# Patient Record
Sex: Male | Born: 2000 | Race: Black or African American | Hispanic: No | Marital: Single
Health system: Southern US, Community
[De-identification: ages and names within clinical notes are randomized; demographics above are authoritative.]

## PROBLEM LIST (undated history)

## (undated) DIAGNOSIS — Z789 Other specified health status: Secondary | ICD-10-CM

## (undated) HISTORY — PX: NO PAST SURGERIES: SHX2092

---
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
## 2021-10-09 ENCOUNTER — Ambulatory Visit
Admission: EM | Admit: 2021-10-09 | Discharge: 2021-10-09 | Disposition: A | Discharge status: Home / Self Care | Payer: BC Managed Care – PPO | Attending: Internal Medicine | Admitting: Internal Medicine

## 2021-10-09 DIAGNOSIS — S161XXA Strain of muscle, fascia and tendon at neck level, initial encounter: Secondary | ICD-10-CM | POA: Diagnosis not present

## 2021-10-09 MED ORDER — IBUPROFEN 600 MG PO TABS: 600.0000 mg | ORAL_TABLET | Freq: Four times a day (QID) | ORAL | 0 refills | Status: DC | PRN

## 2021-10-09 MED ORDER — CYCLOBENZAPRINE HCL 5 MG PO TABS: 5.0000 mg | ORAL_TABLET | Freq: Two times a day (BID) | ORAL | 0 refills | Status: DC | PRN
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
## 2021-10-09 NOTE — Discharge Instructions (Addendum)
You have a muscle strain of your neck.  You have been prescribed muscle relaxer and ibuprofen to take as needed.  Please be advised that muscle relaxer can cause drowsiness.  Do not take any additional ibuprofen, Advil, Aleve while taking this ibuprofen.  Follow-up if symptoms persist or worsen.

## 2023-05-17 ENCOUNTER — Emergency Department (HOSPITAL_BASED_OUTPATIENT_CLINIC_OR_DEPARTMENT_OTHER): Payer: BC Managed Care – PPO | Admitting: Radiology

## 2023-05-17 ENCOUNTER — Other Ambulatory Visit: Payer: Self-pay

## 2023-05-17 ENCOUNTER — Encounter (HOSPITAL_BASED_OUTPATIENT_CLINIC_OR_DEPARTMENT_OTHER): Payer: Self-pay

## 2023-05-17 ENCOUNTER — Emergency Department (HOSPITAL_BASED_OUTPATIENT_CLINIC_OR_DEPARTMENT_OTHER)
Admission: EM | Admit: 2023-05-17 | Discharge: 2023-05-17 | Disposition: A | Discharge status: Home / Self Care | Payer: BC Managed Care – PPO | Attending: Emergency Medicine | Admitting: Emergency Medicine

## 2023-05-17 ENCOUNTER — Other Ambulatory Visit (HOSPITAL_BASED_OUTPATIENT_CLINIC_OR_DEPARTMENT_OTHER): Payer: Self-pay

## 2023-05-17 DIAGNOSIS — M25572 Pain in left ankle and joints of left foot: Secondary | ICD-10-CM | POA: Diagnosis not present

## 2023-05-17 DIAGNOSIS — M25571 Pain in right ankle and joints of right foot: Secondary | ICD-10-CM | POA: Diagnosis present

## 2023-05-17 DIAGNOSIS — Y92410 Unspecified street and highway as the place of occurrence of the external cause: Secondary | ICD-10-CM | POA: Diagnosis not present

## 2023-05-17 MED ORDER — NAPROXEN 250 MG PO TABS
500.0000 mg | ORAL_TABLET | Freq: Once | ORAL | Status: AC
  Administered 2023-05-17: 500 mg via ORAL
  Filled 2023-05-17: qty 2

## 2023-05-17 MED ORDER — NAPROXEN 375 MG PO TABS
375.0000 mg | ORAL_TABLET | Freq: Two times a day (BID) | ORAL | 0 refills | Status: DC
  Filled 2023-05-17: qty 20, 10d supply, fill #0
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
## 2023-05-17 NOTE — Discharge Instructions (Addendum)
Thank you for letting us evaluate you today.  Your x-rays were negative for fracture.  However, you may have had a muscle strain/sprain.  I have sent naproxen to your pharmacy to use as needed for pain and inflammation.  Do not take ibuprofen, Aleve, aspirin with this.  You may also pick up Voltaren gel to rub in areas of discomfort.  Please make sure to stay off feet, ice area, elevate your feet to reduce swelling.  I have also provided you with orthopedic follow-up if symptoms do not improve within 7-10 days for further management.  Please return to emergency department if you experience significant worsening of pain, swelling, inability to walk.
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
## 2023-05-17 NOTE — ED Notes (Signed)

## 2023-05-27 ENCOUNTER — Other Ambulatory Visit (HOSPITAL_BASED_OUTPATIENT_CLINIC_OR_DEPARTMENT_OTHER): Payer: Self-pay

## 2023-08-14 ENCOUNTER — Emergency Department (HOSPITAL_COMMUNITY)

## 2023-08-14 ENCOUNTER — Encounter (HOSPITAL_COMMUNITY): Payer: Self-pay | Admitting: *Deleted

## 2023-08-14 ENCOUNTER — Inpatient Hospital Stay (HOSPITAL_COMMUNITY)
Admission: EM | Admit: 2023-08-14 | Discharge: 2023-08-18 | DRG: 494 | Disposition: A | Discharge status: Home / Self Care | Attending: Orthopedic Surgery | Admitting: Orthopedic Surgery

## 2023-08-14 ENCOUNTER — Other Ambulatory Visit: Payer: Self-pay

## 2023-08-14 DIAGNOSIS — Z23 Encounter for immunization: Secondary | ICD-10-CM

## 2023-08-14 DIAGNOSIS — S82202B Unspecified fracture of shaft of left tibia, initial encounter for open fracture type I or II: Secondary | ICD-10-CM | POA: Diagnosis present

## 2023-08-14 DIAGNOSIS — S82252B Displaced comminuted fracture of shaft of left tibia, initial encounter for open fracture type I or II: Principal | ICD-10-CM | POA: Diagnosis present

## 2023-08-14 DIAGNOSIS — S5012XA Contusion of left forearm, initial encounter: Secondary | ICD-10-CM | POA: Diagnosis present

## 2023-08-14 HISTORY — DX: Other specified health status: Z78.9

## 2023-08-14 LAB — COMPREHENSIVE METABOLIC PANEL WITH GFR
ALT: 36 U/L (ref 0–44)
AST: 35 U/L (ref 15–41)
Albumin: 4 g/dL (ref 3.5–5.0)
Alkaline Phosphatase: 54 U/L (ref 38–126)
Anion gap: 14 (ref 5–15)
BUN: 13 mg/dL (ref 6–20)
CO2: 21 mmol/L — ABNORMAL LOW (ref 22–32)
Calcium: 8.9 mg/dL (ref 8.9–10.3)
Chloride: 102 mmol/L (ref 98–111)
Creatinine, Ser: 1.04 mg/dL (ref 0.61–1.24)
GFR, Estimated: >60 mL/min (ref >60)
Glucose, Bld: 96 mg/dL (ref 70–99)
Potassium: 4.7 mmol/L (ref 3.5–5.1)
Sodium: 137 mmol/L (ref 135–145)
Total Bilirubin: 0.8 mg/dL (ref 0.0–1.2)
Total Protein: 6.8 g/dL (ref 6.5–8.1)

## 2023-08-14 LAB — CBC
HCT: 44.3 % (ref 39.0–52.0)
Hemoglobin: 14.6 g/dL (ref 13.0–17.0)
MCH: 29.9 pg (ref 26.0–34.0)
MCHC: 33 g/dL (ref 30.0–36.0)
MCV: 90.8 fL (ref 80.0–100.0)
Platelets: 274 10*3/uL (ref 150–400)
RBC: 4.88 MIL/uL (ref 4.22–5.81)
RDW: 11.1 % — ABNORMAL LOW (ref 11.5–15.5)
WBC: 6 10*3/uL (ref 4.0–10.5)
nRBC: 0 % (ref 0.0–0.2)

## 2023-08-14 LAB — PROTIME-INR
INR: 1 (ref 0.8–1.2)
Prothrombin Time: 13.6 s (ref 11.4–15.2)

## 2023-08-14 LAB — I-STAT CHEM 8, ED
BUN: 18 mg/dL (ref 6–20)
BUN: 16 mg/dL (ref 6–20)
Calcium, Ion: 1.02 mmol/L — ABNORMAL LOW (ref 1.15–1.40)
Calcium, Ion: 1.13 mmol/L — ABNORMAL LOW (ref 1.15–1.40)
Chloride: 104 mmol/L (ref 98–111)
Chloride: 102 mmol/L (ref 98–111)
Creatinine, Ser: 1.2 mg/dL (ref 0.61–1.24)
Creatinine, Ser: 1.2 mg/dL (ref 0.61–1.24)
Glucose, Bld: 93 mg/dL (ref 70–99)
Glucose, Bld: 92 mg/dL (ref 70–99)
HCT: 45 % (ref 39.0–52.0)
HCT: 46 % (ref 39.0–52.0)
Hemoglobin: 15.3 g/dL (ref 13.0–17.0)
Hemoglobin: 15.6 g/dL (ref 13.0–17.0)
Potassium: 5.2 mmol/L — ABNORMAL HIGH (ref 3.5–5.1)
Potassium: 4.6 mmol/L (ref 3.5–5.1)
Sodium: 137 mmol/L (ref 135–145)
Sodium: 138 mmol/L (ref 135–145)
TCO2: 27 mmol/L (ref 22–32)
TCO2: 29 mmol/L (ref 22–32)

## 2023-08-14 LAB — I-STAT CG4 LACTIC ACID, ED: Lactic Acid, Venous: 1.4 mmol/L (ref 0.5–1.9)

## 2023-08-14 LAB — ETHANOL: Alcohol, Ethyl (B): <15 mg/dL (ref <15)

## 2023-08-14 LAB — SAMPLE TO BLOOD BANK

## 2023-08-14 MED ORDER — IOHEXOL 350 MG/ML SOLN
75.0000 mL | Freq: Once | INTRAVENOUS | Status: AC | PRN
  Administered 2023-08-14: 75 mL via INTRAVENOUS

## 2023-08-14 MED ORDER — HYDROMORPHONE HCL 1 MG/ML IJ SOLN
1.0000 mg | Freq: Once | INTRAMUSCULAR | Status: AC
  Administered 2023-08-14: 1 mg via INTRAVENOUS
  Filled 2023-08-14: qty 1

## 2023-08-14 MED ORDER — CEFAZOLIN SODIUM-DEXTROSE 2-4 GM/100ML-% IV SOLN
2.0000 g | Freq: Once | INTRAVENOUS | Status: AC
  Administered 2023-08-14: 2 g via INTRAVENOUS

## 2023-08-14 MED ORDER — HYDROMORPHONE HCL 1 MG/ML IJ SOLN
0.5000 mg | Freq: Once | INTRAMUSCULAR | Status: AC
  Administered 2023-08-14: 0.5 mg via INTRAVENOUS
  Filled 2023-08-14: qty 1

## 2023-08-14 MED ORDER — ONDANSETRON HCL 4 MG/2ML IJ SOLN
4.0000 mg | Freq: Once | INTRAMUSCULAR | Status: AC
  Administered 2023-08-14: 4 mg via INTRAVENOUS
  Filled 2023-08-14: qty 2

## 2023-08-14 MED ORDER — TETANUS-DIPHTH-ACELL PERTUSSIS 5-2.5-18.5 LF-MCG/0.5 IM SUSY
PREFILLED_SYRINGE | INTRAMUSCULAR | Status: AC
  Administered 2023-08-14: 0.5 mL via INTRAMUSCULAR
  Filled 2023-08-14: qty 0.5

## 2023-08-14 MED ORDER — TETANUS-DIPHTH-ACELL PERTUSSIS 5-2.5-18.5 LF-MCG/0.5 IM SUSY: 0.5000 mL | PREFILLED_SYRINGE | Freq: Once | INTRAMUSCULAR | Status: AC
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
## 2023-08-14 NOTE — ED Notes (Signed)
 CCMD contacted for monitoring

## 2023-08-14 NOTE — ED Notes (Signed)
 Left leg wound irrigated by provider, pressure dressing applied; Ortho tech applied splint to lower leg.
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
## 2023-08-14 NOTE — ED Notes (Signed)
 MD at bedside; blood noted to be dripping off the end of the bed from left leg. Pressure dressing being applied
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
## 2023-08-15 ENCOUNTER — Encounter (HOSPITAL_COMMUNITY)
Admission: EM | Disposition: A | Discharge status: Home / Self Care | Payer: Self-pay | Source: Home / Self Care | Attending: Orthopedic Surgery

## 2023-08-15 ENCOUNTER — Inpatient Hospital Stay (HOSPITAL_COMMUNITY)

## 2023-08-15 ENCOUNTER — Encounter (HOSPITAL_COMMUNITY): Payer: Self-pay | Admitting: Orthopedic Surgery

## 2023-08-15 ENCOUNTER — Inpatient Hospital Stay (HOSPITAL_COMMUNITY): Admitting: Certified Registered Nurse Anesthetist

## 2023-08-15 ENCOUNTER — Other Ambulatory Visit: Payer: Self-pay

## 2023-08-15 DIAGNOSIS — S82252B Displaced comminuted fracture of shaft of left tibia, initial encounter for open fracture type I or II: Secondary | ICD-10-CM | POA: Diagnosis present

## 2023-08-15 DIAGNOSIS — S5012XA Contusion of left forearm, initial encounter: Secondary | ICD-10-CM | POA: Diagnosis present

## 2023-08-15 DIAGNOSIS — Z23 Encounter for immunization: Secondary | ICD-10-CM | POA: Diagnosis not present

## 2023-08-15 DIAGNOSIS — S82202B Unspecified fracture of shaft of left tibia, initial encounter for open fracture type I or II: Secondary | ICD-10-CM | POA: Diagnosis present

## 2023-08-15 HISTORY — PX: IRRIGATION AND DEBRIDEMENT POSTERIOR HIP: SHX7265

## 2023-08-15 HISTORY — PX: APPLICATION OF WOUND VAC: SHX5189

## 2023-08-15 HISTORY — PX: TIBIA IM NAIL INSERTION: SHX2516

## 2023-08-15 LAB — CBC
HCT: 36.4 % — ABNORMAL LOW (ref 39.0–52.0)
Hemoglobin: 12.2 g/dL — ABNORMAL LOW (ref 13.0–17.0)
MCH: 29.9 pg (ref 26.0–34.0)
MCHC: 33.5 g/dL (ref 30.0–36.0)
MCV: 89.2 fL (ref 80.0–100.0)
Platelets: 211 10*3/uL (ref 150–400)
RBC: 4.08 MIL/uL — ABNORMAL LOW (ref 4.22–5.81)
RDW: 11 % — ABNORMAL LOW (ref 11.5–15.5)
WBC: 9.8 10*3/uL (ref 4.0–10.5)
nRBC: 0 % (ref 0.0–0.2)

## 2023-08-15 LAB — MRSA NEXT GEN BY PCR, NASAL: MRSA by PCR Next Gen: NOT DETECTED

## 2023-08-15 LAB — HIV ANTIBODY (ROUTINE TESTING W REFLEX): HIV Screen 4th Generation wRfx: NONREACTIVE

## 2023-08-15 LAB — CREATININE, SERUM
Creatinine, Ser: 1.03 mg/dL (ref 0.61–1.24)
GFR, Estimated: >60 mL/min (ref >60)

## 2023-08-15 SURGERY — IRRIGATION AND DEBRIDEMENT, OPEN FRACTURE
Anesthesia: General | Site: Leg Lower | Laterality: Left

## 2023-08-15 MED ORDER — FENTANYL CITRATE (PF) 100 MCG/2ML IJ SOLN
25.0000 ug | INTRAMUSCULAR | Status: DC | PRN
  Administered 2023-08-15 (×2): 50 ug via INTRAVENOUS

## 2023-08-15 MED ORDER — LACTATED RINGERS IV SOLN: INTRAVENOUS | Status: DC

## 2023-08-15 MED ORDER — HYDROMORPHONE HCL 1 MG/ML IJ SOLN: 0.5000 mg | INTRAMUSCULAR | Status: DC | PRN

## 2023-08-15 MED ORDER — CHLORHEXIDINE GLUCONATE 0.12 % MT SOLN
15.0000 mL | Freq: Once | OROMUCOSAL | Status: AC
  Administered 2023-08-15: 15 mL via OROMUCOSAL

## 2023-08-15 MED ORDER — ONDANSETRON HCL 4 MG/2ML IJ SOLN: 4.0000 mg | Freq: Four times a day (QID) | INTRAMUSCULAR | Status: DC | PRN

## 2023-08-15 MED ORDER — DOCUSATE SODIUM 100 MG PO CAPS
100.0000 mg | ORAL_CAPSULE | Freq: Two times a day (BID) | ORAL | Status: DC
  Administered 2023-08-15 – 2023-08-18 (×6): 100 mg via ORAL
  Filled 2023-08-15 (×7): qty 1

## 2023-08-15 MED ORDER — PROPOFOL 10 MG/ML IV BOLUS
INTRAVENOUS | Status: AC
Start: 2023-08-15 — End: ?
  Filled 2023-08-15: qty 20

## 2023-08-15 MED ORDER — ROCURONIUM BROMIDE 10 MG/ML (PF) SYRINGE
PREFILLED_SYRINGE | INTRAVENOUS | Status: AC
Start: 2023-08-15 — End: ?
  Filled 2023-08-15: qty 10

## 2023-08-15 MED ORDER — OXYCODONE HCL 5 MG PO TABS
5.0000 mg | ORAL_TABLET | ORAL | Status: DC | PRN
  Administered 2023-08-15 – 2023-08-16 (×6): 10 mg via ORAL
  Filled 2023-08-15 (×6): qty 2

## 2023-08-15 MED ORDER — KETOROLAC TROMETHAMINE 30 MG/ML IJ SOLN
INTRAMUSCULAR | Status: DC | PRN
  Administered 2023-08-15: 30 mg via INTRAVENOUS

## 2023-08-15 MED ORDER — ONDANSETRON HCL 4 MG PO TABS: 4.0000 mg | ORAL_TABLET | Freq: Four times a day (QID) | ORAL | Status: DC | PRN

## 2023-08-15 MED ORDER — OXYCODONE HCL 5 MG/5ML PO SOLN: 5.0000 mg | Freq: Once | ORAL | Status: DC | PRN

## 2023-08-15 MED ORDER — ACETAMINOPHEN 325 MG PO TABS
325.0000 mg | ORAL_TABLET | Freq: Four times a day (QID) | ORAL | Status: DC | PRN
  Administered 2023-08-16: 650 mg via ORAL
  Filled 2023-08-15: qty 2

## 2023-08-15 MED ORDER — SODIUM CHLORIDE 0.9 % IV SOLN: INTRAVENOUS | Status: AC

## 2023-08-15 MED ORDER — MIDAZOLAM HCL 2 MG/2ML IJ SOLN
INTRAMUSCULAR | Status: AC
  Filled 2023-08-15: qty 2

## 2023-08-15 MED ORDER — TRANEXAMIC ACID-NACL 1000-0.7 MG/100ML-% IV SOLN: 1000.0000 mg | INTRAVENOUS | Status: DC

## 2023-08-15 MED ORDER — METOCLOPRAMIDE HCL 5 MG PO TABS: 5.0000 mg | ORAL_TABLET | Freq: Three times a day (TID) | ORAL | Status: DC | PRN

## 2023-08-15 MED ORDER — ROCURONIUM BROMIDE 10 MG/ML (PF) SYRINGE
PREFILLED_SYRINGE | INTRAVENOUS | Status: DC | PRN
  Administered 2023-08-15: 50 mg via INTRAVENOUS

## 2023-08-15 MED ORDER — DEXAMETHASONE SODIUM PHOSPHATE 10 MG/ML IJ SOLN
INTRAMUSCULAR | Status: AC
  Filled 2023-08-15: qty 1

## 2023-08-15 MED ORDER — LIDOCAINE 2% (20 MG/ML) 5 ML SYRINGE
INTRAMUSCULAR | Status: DC | PRN
  Administered 2023-08-15: 60 mg via INTRAVENOUS

## 2023-08-15 MED ORDER — MORPHINE SULFATE (PF) 2 MG/ML IV SOLN
0.5000 mg | INTRAVENOUS | Status: DC | PRN
  Administered 2023-08-15: 1 mg via INTRAVENOUS
  Filled 2023-08-15: qty 1

## 2023-08-15 MED ORDER — HYDROCODONE-ACETAMINOPHEN 7.5-325 MG PO TABS
1.0000 | ORAL_TABLET | ORAL | Status: DC | PRN
  Administered 2023-08-15: 2 via ORAL
  Filled 2023-08-15: qty 2

## 2023-08-15 MED ORDER — SENNA 8.6 MG PO TABS
1.0000 | ORAL_TABLET | Freq: Two times a day (BID) | ORAL | Status: DC
  Administered 2023-08-15 – 2023-08-18 (×6): 8.6 mg via ORAL
  Filled 2023-08-15 (×7): qty 1

## 2023-08-15 MED ORDER — DEXAMETHASONE SODIUM PHOSPHATE 10 MG/ML IJ SOLN
INTRAMUSCULAR | Status: DC | PRN
Start: 2023-08-15 — End: 2023-08-15
  Administered 2023-08-15: 10 mg via INTRAVENOUS

## 2023-08-15 MED ORDER — FENTANYL CITRATE (PF) 100 MCG/2ML IJ SOLN
INTRAMUSCULAR | Status: AC
  Filled 2023-08-15: qty 2

## 2023-08-15 MED ORDER — TRANEXAMIC ACID-NACL 1000-0.7 MG/100ML-% IV SOLN: 1000.0000 mg | Freq: Once | INTRAVENOUS | Status: AC

## 2023-08-15 MED ORDER — TRANEXAMIC ACID-NACL 1000-0.7 MG/100ML-% IV SOLN
INTRAVENOUS | Status: AC
  Administered 2023-08-15: 1000 mg via INTRAVENOUS
  Filled 2023-08-15: qty 100

## 2023-08-15 MED ORDER — PROPOFOL 10 MG/ML IV BOLUS
INTRAVENOUS | Status: DC | PRN
  Administered 2023-08-15: 180 mg via INTRAVENOUS
  Administered 2023-08-15: 20 mg via INTRAVENOUS

## 2023-08-15 MED ORDER — CHLORHEXIDINE GLUCONATE 4 % EX SOLN: 60.0000 mL | Freq: Once | CUTANEOUS | Status: DC

## 2023-08-15 MED ORDER — OXYCODONE HCL 5 MG PO TABS: 5.0000 mg | ORAL_TABLET | Freq: Once | ORAL | Status: DC | PRN

## 2023-08-15 MED ORDER — ONDANSETRON HCL 4 MG/2ML IJ SOLN
INTRAMUSCULAR | Status: AC
  Filled 2023-08-15: qty 2

## 2023-08-15 MED ORDER — LACTATED RINGERS IV SOLN: INTRAVENOUS | Status: DC | PRN

## 2023-08-15 MED ORDER — ONDANSETRON HCL 4 MG/2ML IJ SOLN: 4.0000 mg | Freq: Three times a day (TID) | INTRAMUSCULAR | Status: DC | PRN

## 2023-08-15 MED ORDER — ORAL CARE MOUTH RINSE: 15.0000 mL | Freq: Once | OROMUCOSAL | Status: AC

## 2023-08-15 MED ORDER — CEFAZOLIN SODIUM-DEXTROSE 2-4 GM/100ML-% IV SOLN: 2.0000 g | INTRAVENOUS | Status: DC

## 2023-08-15 MED ORDER — LIDOCAINE 2% (20 MG/ML) 5 ML SYRINGE
INTRAMUSCULAR | Status: AC
  Filled 2023-08-15: qty 5

## 2023-08-15 MED ORDER — ACETAMINOPHEN 10 MG/ML IV SOLN
1000.0000 mg | Freq: Once | INTRAVENOUS | Status: DC | PRN
  Administered 2023-08-15: 1000 mg via INTRAVENOUS

## 2023-08-15 MED ORDER — SUCCINYLCHOLINE CHLORIDE 200 MG/10ML IV SOSY
PREFILLED_SYRINGE | INTRAVENOUS | Status: AC
  Filled 2023-08-15: qty 10

## 2023-08-15 MED ORDER — POVIDONE-IODINE 10 % EX SWAB: 2.0000 | Freq: Once | CUTANEOUS | Status: DC

## 2023-08-15 MED ORDER — ENOXAPARIN SODIUM 40 MG/0.4ML IJ SOSY
40.0000 mg | PREFILLED_SYRINGE | INTRAMUSCULAR | Status: DC
  Administered 2023-08-16 – 2023-08-18 (×3): 40 mg via SUBCUTANEOUS
  Filled 2023-08-15 (×3): qty 0.4

## 2023-08-15 MED ORDER — SUCCINYLCHOLINE CHLORIDE 200 MG/10ML IV SOSY
PREFILLED_SYRINGE | INTRAVENOUS | Status: DC | PRN
  Administered 2023-08-15: 140 mg via INTRAVENOUS

## 2023-08-15 MED ORDER — PROPOFOL 10 MG/ML IV BOLUS
INTRAVENOUS | Status: AC
  Filled 2023-08-15: qty 20

## 2023-08-15 MED ORDER — DEXMEDETOMIDINE HCL IN NACL 200 MCG/50ML IV SOLN
INTRAVENOUS | Status: DC | PRN
  Administered 2023-08-15 (×2): 8 ug via INTRAVENOUS
  Administered 2023-08-15: 16 ug via INTRAVENOUS

## 2023-08-15 MED ORDER — METHOCARBAMOL 1000 MG/10ML IJ SOLN: 500.0000 mg | Freq: Four times a day (QID) | INTRAMUSCULAR | Status: DC | PRN

## 2023-08-15 MED ORDER — CHLORHEXIDINE GLUCONATE 0.12 % MT SOLN
OROMUCOSAL | Status: AC
  Filled 2023-08-15: qty 15

## 2023-08-15 MED ORDER — ACETAMINOPHEN 10 MG/ML IV SOLN
INTRAVENOUS | Status: AC
  Filled 2023-08-15: qty 100

## 2023-08-15 MED ORDER — MIDAZOLAM HCL 2 MG/2ML IJ SOLN
INTRAMUSCULAR | Status: DC | PRN
  Administered 2023-08-15: 2 mg via INTRAVENOUS

## 2023-08-15 MED ORDER — FENTANYL CITRATE (PF) 250 MCG/5ML IJ SOLN
INTRAMUSCULAR | Status: AC
  Filled 2023-08-15: qty 5

## 2023-08-15 MED ORDER — METHOCARBAMOL 500 MG PO TABS
500.0000 mg | ORAL_TABLET | Freq: Four times a day (QID) | ORAL | Status: DC | PRN
  Administered 2023-08-15 – 2023-08-17 (×3): 500 mg via ORAL
  Filled 2023-08-15 (×3): qty 1

## 2023-08-15 MED ORDER — HYDROCODONE-ACETAMINOPHEN 5-325 MG PO TABS: 1.0000 | ORAL_TABLET | ORAL | Status: DC | PRN

## 2023-08-15 MED ORDER — CEFAZOLIN SODIUM-DEXTROSE 2-4 GM/100ML-% IV SOLN
2.0000 g | Freq: Three times a day (TID) | INTRAVENOUS | Status: AC
  Administered 2023-08-15 – 2023-08-16 (×3): 2 g via INTRAVENOUS
  Filled 2023-08-15 (×3): qty 100

## 2023-08-15 MED ORDER — METOCLOPRAMIDE HCL 5 MG/ML IJ SOLN: 5.0000 mg | Freq: Three times a day (TID) | INTRAMUSCULAR | Status: DC | PRN

## 2023-08-15 MED ORDER — ONDANSETRON HCL 4 MG/2ML IJ SOLN
INTRAMUSCULAR | Status: DC | PRN
  Administered 2023-08-15: 4 mg via INTRAVENOUS

## 2023-08-15 MED ORDER — HYDROMORPHONE HCL 1 MG/ML IJ SOLN
0.5000 mg | INTRAMUSCULAR | Status: DC | PRN
  Administered 2023-08-15 – 2023-08-17 (×9): 0.5 mg via INTRAVENOUS
  Filled 2023-08-15 (×9): qty 0.5

## 2023-08-15 MED ORDER — HYDROMORPHONE HCL 1 MG/ML IJ SOLN
1.0000 mg | INTRAMUSCULAR | Status: DC | PRN
  Administered 2023-08-15 (×4): 1 mg via INTRAVENOUS
  Filled 2023-08-15 (×4): qty 1

## 2023-08-15 MED ORDER — FENTANYL CITRATE (PF) 250 MCG/5ML IJ SOLN
INTRAMUSCULAR | Status: DC | PRN
  Administered 2023-08-15: 100 ug via INTRAVENOUS
  Administered 2023-08-15 (×3): 50 ug via INTRAVENOUS

## 2023-08-15 MED ORDER — 0.9 % SODIUM CHLORIDE (POUR BTL) OPTIME
TOPICAL | Status: DC | PRN
  Administered 2023-08-15: 1000 mL

## 2023-08-15 MED ORDER — SORBITOL 70 % SOLN: 30.0000 mL | Freq: Every day | Status: DC | PRN

## 2023-08-15 MED ORDER — CEFAZOLIN SODIUM-DEXTROSE 2-4 GM/100ML-% IV SOLN
2.0000 g | Freq: Three times a day (TID) | INTRAVENOUS | Status: DC
  Administered 2023-08-15: 2 g via INTRAVENOUS
  Filled 2023-08-15: qty 100

## 2023-08-15 SURGICAL SUPPLY — 69 items
BAG COUNTER SPONGE SURGICOUNT (BAG) ×2 IMPLANT
BANDAGE ESMARK 6X9 LF (GAUZE/BANDAGES/DRESSINGS) ×2 IMPLANT
BIT DRILL CROWE POINT TWST 4.3 (DRILL) IMPLANT
BLADE SURG 10 STRL SS (BLADE) ×2 IMPLANT
BNDG ELASTIC 4X5.8 VLCR NS LF (GAUZE/BANDAGES/DRESSINGS) IMPLANT
BNDG ELASTIC 4X5.8 VLCR STR LF (GAUZE/BANDAGES/DRESSINGS) ×2 IMPLANT
BNDG ELASTIC 6INX 5YD STR LF (GAUZE/BANDAGES/DRESSINGS) ×2 IMPLANT
BNDG ELASTIC 6X10 VLCR STRL LF (GAUZE/BANDAGES/DRESSINGS) IMPLANT
BNDG GAUZE DERMACEA FLUFF 4 (GAUZE/BANDAGES/DRESSINGS) ×2 IMPLANT
BRUSH SCRUB EZ PLAIN DRY (MISCELLANEOUS) ×4 IMPLANT
COVER SURGICAL LIGHT HANDLE (MISCELLANEOUS) ×4 IMPLANT
CUFF TOURN SGL QUICK 18X4 (TOURNIQUET CUFF) IMPLANT
CUFF TRNQT CYL 34X4.125X (TOURNIQUET CUFF) IMPLANT
DRAPE C-ARM 42X72 X-RAY (DRAPES) ×2 IMPLANT
DRAPE C-ARMOR (DRAPES) ×2 IMPLANT
DRAPE HALF SHEET 40X57 (DRAPES) IMPLANT
DRAPE INCISE IOBAN 66X45 STRL (DRAPES) IMPLANT
DRAPE U-SHAPE 47X51 STRL (DRAPES) ×2 IMPLANT
DRESSING MEPILEX FLEX 4X4 (GAUZE/BANDAGES/DRESSINGS) IMPLANT
DRESSING PEEL AND PLC PRVNA 13 (GAUZE/BANDAGES/DRESSINGS) IMPLANT
DRILL SHORT 4.3 INTERLOCK (DRILL) IMPLANT
DRSG ADAPTIC 3X8 NADH LF (GAUZE/BANDAGES/DRESSINGS) ×2 IMPLANT
DRSG MEPILEX POST OP 4X8 (GAUZE/BANDAGES/DRESSINGS) IMPLANT
DRSG MEPITEL 4X7.2 (GAUZE/BANDAGES/DRESSINGS) ×2 IMPLANT
DURAPREP 26ML APPLICATOR (WOUND CARE) ×2 IMPLANT
ELECTRODE REM PT RTRN 9FT ADLT (ELECTROSURGICAL) ×2 IMPLANT
GAUZE PAD ABD 8X10 STRL (GAUZE/BANDAGES/DRESSINGS) ×4 IMPLANT
GAUZE SPONGE 4X4 12PLY STRL (GAUZE/BANDAGES/DRESSINGS) ×2 IMPLANT
GLOVE BIO SURGEON STRL SZ7.5 (GLOVE) ×2 IMPLANT
GLOVE BIO SURGEON STRL SZ8 (GLOVE) ×2 IMPLANT
GLOVE BIO SURGEON STRL SZ8.5 (GLOVE) ×2 IMPLANT
GLOVE BIOGEL M 7.0 STRL (GLOVE) ×2 IMPLANT
GLOVE BIOGEL M STRL SZ7.5 (GLOVE) IMPLANT
GLOVE BIOGEL PI IND STRL 7.5 (GLOVE) ×4 IMPLANT
GLOVE BIOGEL PI IND STRL 8 (GLOVE) ×2 IMPLANT
GLOVE BIOGEL PI IND STRL 8.5 (GLOVE) ×2 IMPLANT
GLOVE ECLIPSE 8.0 STRL XLNG CF (GLOVE) IMPLANT
GLOVE ORTHO TXT STRL SZ7.5 (GLOVE) ×4 IMPLANT
GLOVE SURG ORTHO 8.5 STRL (GLOVE) ×6 IMPLANT
GLOVE SURG ORTHO LTX SZ7.5 (GLOVE) ×4 IMPLANT
GOWN STRL REUS W/ TWL LRG LVL3 (GOWN DISPOSABLE) ×4 IMPLANT
GOWN STRL REUS W/ TWL XL LVL3 (GOWN DISPOSABLE) ×6 IMPLANT
GUIDEPIN 3.2X17.5 THRD DISP (PIN) IMPLANT
GUIDEWIRE BEAD TIP 100 ST (WIRE) IMPLANT
GUIDWIRE 80 BEAD TIP (WIRE) IMPLANT
KIT BASIN OR (CUSTOM PROCEDURE TRAY) ×2 IMPLANT
KIT TURNOVER KIT B (KITS) ×2 IMPLANT
MANIFOLD NEPTUNE II (INSTRUMENTS) ×2 IMPLANT
NAIL TIBIA AFFIXUS 10X400 ST (Nail) IMPLANT
PACK ORTHO EXTREMITY (CUSTOM PROCEDURE TRAY) ×2 IMPLANT
PAD ARMBOARD POSITIONER FOAM (MISCELLANEOUS) ×4 IMPLANT
PAD CAST 4YDX4 CTTN HI CHSV (CAST SUPPLIES) ×2 IMPLANT
PADDING CAST ABS COTTON 4X4 ST (CAST SUPPLIES) IMPLANT
PADDING CAST ABS COTTON 6X4 NS (CAST SUPPLIES) IMPLANT
PADDING CAST COTTON 6X4 STRL (CAST SUPPLIES) ×2 IMPLANT
SCREW CORT AFFIX ST 5X40 (Screw) IMPLANT
SCREW CORT AFFIX ST 5X70 (Screw) IMPLANT
SCREW CORTICAL BONE 5X42 ST (Screw) IMPLANT
SET HNDPC FAN SPRY TIP SCT (DISPOSABLE) ×2 IMPLANT
STAPLER VISISTAT 35W (STAPLE) ×2 IMPLANT
SUT ETHILON 2 0 FS 18 (SUTURE) ×4 IMPLANT
SUT ETHILON 2 0 PSLX (SUTURE) IMPLANT
SUT MON AB 2-0 CT1 36 (SUTURE) IMPLANT
SUT VIC AB 0 CT1 27XBRD ANBCTR (SUTURE) IMPLANT
SUT VIC AB 2-0 CT1 TAPERPNT 27 (SUTURE) ×2 IMPLANT
SYR CONTROL 10ML LL (SYRINGE) ×2 IMPLANT
TOWEL GREEN STERILE (TOWEL DISPOSABLE) ×4 IMPLANT
TOWEL GREEN STERILE FF (TOWEL DISPOSABLE) ×2 IMPLANT
YANKAUER SUCT BULB TIP NO VENT (SUCTIONS) IMPLANT

## 2023-08-15 NOTE — Plan of Care (Signed)
  Problem: Education: Goal: Knowledge of General Education information will improve Description: Including pain rating scale, medication(s)/side effects and non-pharmacologic comfort measures Outcome: Progressing   Problem: Activity: Goal: Risk for activity intolerance will decrease Outcome: Progressing   Problem: Nutrition: Goal: Adequate nutrition will be maintained Outcome: Progressing   Problem: Elimination: Goal: Will not experience complications related to bowel motility Outcome: Progressing Goal: Will not experience complications related to urinary retention Outcome: Progressing   Problem: Pain Managment: Goal: General experience of comfort will improve and/or be controlled Outcome: Progressing   Problem: Safety: Goal: Ability to remain free from injury will improve Outcome: Progressing
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
## 2023-08-15 NOTE — Progress Notes (Signed)
 Orthopedic Tech Progress Note Patient Details:  Matthew Cochran October 15, 2000 [id number]  Ortho Devices Type of Ortho Device: Post (short leg) splint Ortho Device/Splint Location: lle Ortho Device/Splint Interventions: Ordered, Application, Adjustment   Post Interventions Patient Tolerated: Well Instructions Provided: Care of device, Adjustment of device  [name] 08/15/2023, 12:08 AM

## 2023-08-15 NOTE — Progress Notes (Signed)
 Orthopedic Tech Progress Note Patient Details:  Matthew Cochran [birth year] [id number]  Patient ID: Matthew Cochran, male   DOB: [birth year], 23 y.o.   MRN: [id number] I attended trauma page. [name] 08/15/2023, 12:03 AM

## 2023-08-15 NOTE — Progress Notes (Signed)
 This nurse was told in report by bedside nurse that pt was unable to urinate. Pt was bladder scanned and had over a liter of urine in the bladder. Bedside RN placed foley before pt came to Pre-op. Dr. Charol Copas made aware of pt being unable to urinate and foley needing to be placed. No new orders rec'd.

## 2023-08-15 NOTE — Transfer of Care (Signed)
 Immediate Anesthesia Transfer of Care Note  Patient: Matthew Cochran  Procedure(s) Performed: IRRIGATION AND DEBRIDEMENT, OPEN FRACTURE (Left) INSERTION, INTRAMEDULLARY ROD, TIBIA (Left) APPLICATION, WOUND VAC (Left: Leg Lower)  Patient Location: PACU  Anesthesia Type:General  Level of Consciousness: awake, alert , and oriented  Airway & Oxygen Therapy: Patient Spontanous Breathing  Post-op Assessment: Report given to RN and Post -op Vital signs reviewed and stable  Post vital signs: Reviewed and stable  Last Vitals:  Vitals Value Taken Time  BP 134/83 08/15/23 1334  Temp 98   Pulse 89 08/15/23 1338  Resp 25 08/15/23 1338  SpO2 94 % 08/15/23 1338  Vitals shown include unfiled device data.  Last Pain:  Vitals:   08/15/23 0906  TempSrc:   PainSc: 9          Complications: There were no known notable events for this encounter.

## 2023-08-15 NOTE — Anesthesia Preprocedure Evaluation (Signed)
 Anesthesia Evaluation  Patient identified by MRN, date of birth, ID band Patient awake    Reviewed: Allergy & Precautions, H&P , NPO status , Patient's Chart, lab work & pertinent test results  History of Anesthesia Complications Negative for: history of anesthetic complications  Airway Mallampati: I  TM Distance: >3 FB Neck ROM: Full    Dental  (+) Teeth Intact, Dental Advisory Given,    Pulmonary neg shortness of breath, neg COPD, neg recent URI, Current Smoker and Patient abstained from smoking.   breath sounds clear to auscultation       Cardiovascular negative cardio ROS  Rhythm:Regular     Neuro/Psych negative neurological ROS  negative psych ROS   GI/Hepatic negative GI ROS, Neg liver ROS,,,  Endo/Other  negative endocrine ROS    Renal/GU negative Renal ROS     Musculoskeletal  open left tibia fracture   Abdominal   Peds  Hematology negative hematology ROS (+)   Anesthesia Other Findings nauseated  Reproductive/Obstetrics                             Anesthesia Physical Anesthesia Plan  ASA: 2  Anesthesia Plan: General   Post-op Pain Management: Ofirmev IV (intra-op)*, Toradol IV (intra-op)* and Ketamine IV*   Induction: Intravenous, Rapid sequence and Cricoid pressure planned  PONV Risk Score and Plan: 2 and Ondansetron and Dexamethasone  Airway Management Planned: Oral ETT  Additional Equipment: None  Intra-op Plan:   Post-operative Plan: Extubation in OR  Informed Consent: I have reviewed the patients History and Physical, chart, labs and discussed the procedure including the risks, benefits and alternatives for the proposed anesthesia with the patient or authorized representative who has indicated his/her understanding and acceptance.     Dental advisory given  Plan Discussed with: CRNA  Anesthesia Plan Comments:        Anesthesia Quick Evaluation
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
## 2023-08-15 NOTE — Anesthesia Postprocedure Evaluation (Signed)
 Anesthesia Post Note  Patient: Matthew Cochran  Procedure(s) Performed: IRRIGATION AND DEBRIDEMENT, OPEN FRACTURE (Left) INSERTION, INTRAMEDULLARY ROD, TIBIA (Left) APPLICATION, WOUND VAC (Left: Leg Lower)     Patient location during evaluation: Nursing Unit Anesthesia Type: General Level of consciousness: awake and alert Pain management: pain level controlled Vital Signs Assessment: post-procedure vital signs reviewed and stable Respiratory status: spontaneous breathing, nonlabored ventilation and respiratory function stable Cardiovascular status: blood pressure returned to baseline and stable Postop Assessment: no apparent nausea or vomiting Anesthetic complications: no   There were no known notable events for this encounter.  Last Vitals:  Vitals:   08/15/23 1430 08/15/23 1540  BP: 134/80 136/81  Pulse: 71 64  Resp: 17 17  Temp: 36.8 C 36.8 C  SpO2: 94% 93%    Last Pain:  Vitals:   08/15/23 1540  TempSrc: Oral  PainSc:                  Shena Vinluan

## 2023-08-15 NOTE — Anesthesia Procedure Notes (Signed)
 Procedure Name: Intubation Date/Time: 08/15/2023 12:00 PM  Performed by: Samul Croft, CRNAPre-anesthesia Checklist: Patient identified, Emergency Drugs available, Suction available and Patient being monitored Patient Re-evaluated:Patient Re-evaluated prior to induction Oxygen Delivery Method: Circle System Utilized Preoxygenation: Pre-oxygenation with 100% oxygen Induction Type: IV induction Ventilation: Mask ventilation without difficulty Laryngoscope Size: Mac and 3 Grade View: Grade II Tube type: Oral Tube size: 7.5 mm Number of attempts: 1 Airway Equipment and Method: Stylet and Oral airway Placement Confirmation: ETT inserted through vocal cords under direct vision, positive ETCO2 and breath sounds checked- equal and bilateral Secured at: 20 cm Tube secured with: Tape Dental Injury: Teeth and Oropharynx as per pre-operative assessment

## 2023-08-15 NOTE — Op Note (Signed)
 OPERATIVE REPORT   08/15/2023  1:55 PM  PATIENT:  Matthew Cochran   SURGEON:  Jonnie Nettles, MD  ASSISTANT:  Staff.   PREOPERATIVE DIAGNOSIS: Grade 2 open left tibia fracture  POSTOPERATIVE DIAGNOSIS:  Same.  PROCEDURE:  1.  Excisional debridement skin, subcutaneous tissue, muscle, and bone left lower extremity with closure of complex traumatic wound totaling 6.5 cm. 2.  Intramedullary fixation left tibia fracture. 3.  Application of negative pressure incisional dressing.  ANESTHESIA:   GETA.  ANTIBIOTICS: 2 g Ancef.  IMPLANTS: Biomet Affixus tibia nail size 10 x 400 mm with distal interlocking screw x 1 and proximal interlocking screw x 2.  SPECIMENS: None.  COMPLICATIONS: None.  DISPOSITION: Stable to PACU.  SURGICAL INDICATIONS:  [name] is a 23 y.o. male who presented last night with an open left tibia fracture.  In the emergency department, he received tetanus immunization, IV antibiotics, irrigation of the wound, and splinting by the emergency department.  He was indicated for operative debridement and intramedullary fixation.  The risks, benefits, and alternatives were discussed with the patient preoperatively including but not limited to the risks of infection, bleeding, nerve / blood vessel injury, malunion, nonunion, cardiopulmonary complications, the need for repeat surgery, among others, and the patient was willing to proceed.  PROCEDURE IN DETAIL: The patient was identified in the holding area using 2 identifiers.  The surgical site was marked by myself.  He was taken to the operating room, and general anesthesia was induced.  He was then transferred to the operating room table, and placed in the supine position.  All bony prominences were well-padded.  Left lower extremity was positioned on a bone foam positioner.  The left lower extremity was prepped and draped in the normal sterile surgical fashion.  Timeout was called, verifying side and site of  surgery.  He did receive IV antibiotics within 60 minutes of beginning the procedure.  I began by examining the left lower extremity.  Mild to moderate swelling.  Compartments soft and compressible.  He had a roughly 5 cm wound over the anterior compartment directly at the fracture site.  Skin edges were nonviable.  Therefore, using a #15 blade, I performed an excisional debridement of his wound including skin and subcutaneous tissue.  I extended the wound proximally and distally for further evaluation.  There is a small area of nonviable muscle, which I excisionally debrided with a ronngeur.  There was 1 small bone fragment which I also removed.  There was no gross contamination.  The wound was copiously irrigated with 6 L of normal saline using pulsatile lavage.  The fracture was reduced with traction and rotation.  I applied a lobster claw clamp to hold the reduction.  Over the lateral edge of the patella, I created a longitudinal skin incision with a #10 blade.  Full-thickness skin flaps were created.  I created a limited lateral patellar arthrotomy.  Using blunt digital dissection, I developed the plane between the patellar tendon and the fat pad.  Using an awl, I identified the starting point for a tibial nail with AP and lateral fluoroscopy views.  I passed the guidepin.  Entry drill was used.  I then passed a ball-tipped guidewire under fluoroscopic control to the level of the physeal scar of the ankle.  I measured the length on lateral fluoroscopy view.  I sequentially reamed up to an 11 mm reamer with minimal chatter.  Therefore a 10 x 400 mm nail was selected.  The nail was assembled on the back table.  The nail was advanced without any difficulty.  Using perfect circle technique, I placed a distal interlocking screw from medial to lateral.  The fracture was then back slapped.  I placed a total of 2 proximal interlocking screws.  The first screw was transverse, placed from medial to lateral.  The  second screw was an oblique screw placed from lateral to medial to avoid the peroneal nerve.  The jig was then removed.  I checked hardware position and fracture alignment on AP and lateral fluoroscopy views.  The wounds were irrigated with normal saline.  The arthrotomy was closed with #1 Vicryl.  2-0 Monocryl was used for deep dermal closure.  The surgical incisions were reapproximated with skin staples.  The traumatic wound was closed with 2-0 nylon simple suture technique.  Prevena negative pressure dressing was placed over the traumatic wound and hooked up to the house VAC at 75 mmHg.  There was excellent seal without leak.  Surgical incisions were dressed with Xeroform, 4 x 4's, and ABD pads.  Bulky compressive dressing was placed with cast padding and an Ace wrap.  The patient was then transferred to his bed, extubated, taken to the PACU in stable condition.  Sponge, needle, and instrument counts were correct end of the case x 2.  There were no known complications.

## 2023-08-15 NOTE — Progress Notes (Signed)
 Pt c/o unable to void x 12h BS >960mL in bladder Some resistance on placement of foley catheter

## 2023-08-16 LAB — CBC
HCT: 33.5 % — ABNORMAL LOW (ref 39.0–52.0)
Hemoglobin: 11.3 g/dL — ABNORMAL LOW (ref 13.0–17.0)
MCH: 30.2 pg (ref 26.0–34.0)
MCHC: 33.7 g/dL (ref 30.0–36.0)
MCV: 89.6 fL (ref 80.0–100.0)
Platelets: 186 10*3/uL (ref 150–400)
RBC: 3.74 MIL/uL — ABNORMAL LOW (ref 4.22–5.81)
RDW: 11.1 % — ABNORMAL LOW (ref 11.5–15.5)
WBC: 7.5 10*3/uL (ref 4.0–10.5)
nRBC: 0 % (ref 0.0–0.2)

## 2023-08-16 LAB — BASIC METABOLIC PANEL WITH GFR
Anion gap: 13 (ref 5–15)
BUN: 7 mg/dL (ref 6–20)
CO2: 21 mmol/L — ABNORMAL LOW (ref 22–32)
Calcium: 8.4 mg/dL — ABNORMAL LOW (ref 8.9–10.3)
Chloride: 103 mmol/L (ref 98–111)
Creatinine, Ser: 0.96 mg/dL (ref 0.61–1.24)
GFR, Estimated: >60 mL/min (ref >60)
Glucose, Bld: 112 mg/dL — ABNORMAL HIGH (ref 70–99)
Potassium: 3.7 mmol/L (ref 3.5–5.1)
Sodium: 137 mmol/L (ref 135–145)

## 2023-08-16 MED ORDER — IBUPROFEN 200 MG PO TABS
800.0000 mg | ORAL_TABLET | Freq: Three times a day (TID) | ORAL | Status: DC
  Administered 2023-08-16 – 2023-08-18 (×6): 800 mg via ORAL
  Filled 2023-08-16 (×7): qty 4

## 2023-08-16 NOTE — TOC CAGE-AID Note (Signed)
 Transition of Care [facility]) - CAGE-AID Screening   Patient Details  Name: Matthew Cochran MRN: [id number] Date of Birth: 09/03/2000  [facility]) CM/SW Contact:    Caridad Silveira E Arline Ketter, LCSW Phone Number: 08/16/2023, 12:31 PM   Clinical Narrative: Met with patient at bedside. Patient denies substance use.   CAGE-AID Screening:    Have You Ever Felt You Ought to Cut Down on Your Drinking or Drug Use?: No Have People Annoyed You By Critizing Your Drinking Or Drug Use?: No Have You Felt Bad Or Guilty About Your Drinking Or Drug Use?: No Have You Ever Had a Drink or Used Drugs First Thing In The Morning to Steady Your Nerves or to Get Rid of a Hangover?: No CAGE-AID Score: 0  Substance Abuse Education Offered: Yes

## 2023-08-16 NOTE — Discharge Instructions (Signed)
 Dr. Adonica Hoose Adult Hip & Knee Specialist [facility] [address] [place], [place] [address] [phone]   POSTOPERATIVE DIRECTIONS    Guidelines Following Surgery   WEIGHT BEARING Weight bearing as tolerated with assist device (walker, cane, etc) as directed, use it as long as suggested by your surgeon or therapist, typically at least 4-6 weeks.   HOME CARE INSTRUCTIONS  Remove items at home which could result in a fall. This includes throw rugs or furniture in walking pathways.  Continue medications as instructed at time of discharge. You may have some home medications which will be placed on hold until you complete the course of blood thinner medication. Keep incisions clean and dry. No tub baths or soaking your incisions.  Sit on chairs with arms. Use the chair arms to help push yourself up when arising.  Walk with walker/crutches as instructed.  You may resume a sexual relationship in one month or when given the OK by your caregiver.  Use walker as long as suggested by your caregivers.  Avoid periods of inactivity such as sitting longer than an hour when not asleep. This helps prevent blood clots.  You may return to work once you are cleared by Designer, industrial/product.  Do not drive a car for 6 weeks or until released by your surgeon.  Do not drive while taking narcotics.  Use ace bandage for two weeks following surgery. Make sure you keep all of your appointments after your operation with all of your doctors and caregivers. You should call the office at the above phone number and make an appointment within 7 days of discharge after the date of your surgery. Please pick up a stool softener and laxative for home use as long as you are requiring pain medications. ICE to the affected hip every three hours for 30 minutes at a time and then as needed for pain and swelling. Continue to use ice on the hip for pain and swelling from surgery. You may notice  swelling that will progress down to the foot and ankle.  This is normal after surgery.  Elevate the leg when you are not up walking on it.   It is important for you to complete the blood thinner medication as prescribed by your doctor. Continue to use the breathing machine which will help keep your temperature down.  It is common for your temperature to cycle up and down following surgery, especially at night when you are not up moving around and exerting yourself.  The breathing machine keeps your lungs expanded and your temperature down.  RANGE OF MOTION AND STRENGTHENING EXERCISES  These exercises are designed to help you keep full movement of your hip joint. Follow your caregiver's or physical therapist's instructions. Perform all exercises about fifteen times, three times per day or as directed. Exercise both hips, even if you have had only one joint replacement. These exercises can be done on a training (exercise) mat, on the floor, on a table or on a bed. Use whatever works the best and is most comfortable for you. Use music or television while you are exercising so that the exercises are a pleasant break in your day. This will make your life better with the exercises acting as a break in routine you can look forward to.  Lying on your back, slowly slide your foot toward your buttocks, raising your knee up off the floor. Then slowly slide your foot back down until your leg is straight again.  Lying on your back spread your legs as far apart as you can without causing discomfort.  Lying on your side, raise your upper leg and foot straight up from the floor as far as is comfortable. Slowly lower the leg and repeat.  Lying on your back, tighten up the muscle in the front of your thigh (quadriceps muscles). You can do this by keeping your leg straight and trying to raise your heel off the floor. This helps strengthen the largest muscle supporting your knee.  Lying on your back, tighten up the muscles of  your buttocks both with the legs straight and with the knee bent at a comfortable angle while keeping your heel on the floor.   MAKE SURE YOU:  Understand these instructions.  Will watch your condition.  Will get help right away if you are not doing well or get worse.  Pick up stool softner and laxative for home use following surgery while on pain medications. Daily dry dressing changes as needed. Continue to use ice for pain and swelling after surgery. Do not use any lotions or creams on the incision until instructed by your surgeon.  You can weight bear as tolerated with walker/crutches on left lower extremity.  Keep incisions clean and dry.  No shower, no tub baths or soaking the incisions. Please make sure you ice and elevate your leg. It will help with pain and swelling.  Please make sure to keep physical therapy appointments. Someone will need to take you to physical therapy while you are on narcotic pain medication.  No driving for at least 2 weeks, or while on pain medication.  Please charge Prevena portable wound vac every day. Follow-up within 7 days of discharge for removal of Prevena negative pressure incisional dressing and for wound re-check

## 2023-08-16 NOTE — Progress Notes (Signed)
 Foley catheter was removed for voiding trial. Patient tolerated well.Will continue to monitor.

## 2023-08-16 NOTE — Progress Notes (Signed)
    Subjective:  Patient reports pain as mild to moderate.  Denies N/V/CP/SOB/Abd pain. He denies any tingling or numbness in LE bilaterally. He reports pain over his knee and medial ankle. He reports he did okay with PT today.  We discussed WBAT but pain could be a limiting factor for a while.  He understands.   Objective:   VITALS:   Vitals:   08/15/23 1914 08/16/23 0443 08/16/23 0845 08/16/23 1531  BP: 133/81 (!) 134/58 (!) 156/69 138/77  Pulse: 66 85 79 68  Resp: 17 16 18 18   Temp: 98.8 F (37.1 C) 98.6 F (37 C) 98.7 F (37.1 C) 98.7 F (37.1 C)  TempSrc:   Oral Oral  SpO2: 98% 99% 100% 100%  Weight:      Height:        NAD. Sitting up in bed.  Neurologically intact ABD soft Neurovascular intact Sensation intact distally Intact pulses distally Dorsiflexion/Plantar flexion intact No cellulitis present Compartment soft Ace bandage over LLE.  Incisions CDI.  Prevena incisional dressing C/D/I. House vac no leaks detected. No drainage output.    Lab Results  Component Value Date   WBC 7.5 08/16/2023   HGB 11.3 (L) 08/16/2023   HCT 33.5 (L) 08/16/2023   MCV 89.6 08/16/2023   PLT 186 08/16/2023   BMET    Component Value Date/Time   NA 137 08/16/2023 0721   K 3.7 08/16/2023 0721   CL 103 08/16/2023 0721   CO2 21 (L) 08/16/2023 0721   GLUCOSE 112 (H) 08/16/2023 0721   BUN 7 08/16/2023 0721   CREATININE 0.96 08/16/2023 0721   CALCIUM 8.4 (L) 08/16/2023 0721   GFRNONAA >60 08/16/2023 0721     Assessment/Plan: 1 Day Post-Op   Principal Problem:   Open left tibial fracture  ABLA. Hemoglobin 11.3.   WBAT with walker DVT ppx: Lovenox in hospital will transition to aspirin 81mg  BID, SCDs, TEDS PO pain control PT/OT: 40 feet with PT.  Dispo:  - Discussed WBAT with walker vs crutches pending comfort, safety, and mobility.  - He does not feel ready to go home today due to pain. He lives at home with his mom and sister, 2nd floor apt.  -  Discussed prevena incisional dressing and vac care at discharge. Will need f/u within 7 days of discharge for removal of prevena incisional dressing.  - Will likely need OPPT. - D/c pending pain control and PT clearance.    Harman Lightning 08/16/2023, 5:28 PM   [facility]  [place] [address], [address], [place], [place] [address] Phone: [phone] [email or web address] Facebook  [employer]

## 2023-08-16 NOTE — TOC Initial Note (Signed)
 Transition of Care [facility]) - Initial/Assessment Note    Patient Details  Name: Matthew Cochran MRN: [id number] Date of Birth: [birth year]  Transition of [facility]) CM/SW Contact:    Adaleen Hulgan E Asharia Lotter, LCSW Phone Number: 08/16/2023, 12:32 PM  Clinical Narrative:                 Met with patient at bedside. Patient states he lives with his mother and sister. Patient drives self at baseline. Patient is not sure who his PCP is. [facility]acy [facility]. Patient states he has a good support system. Patient states he has steps to enter his second floor apartment, requests to speak to [facility] about OPPT and DME needs. Updated RNCM.  Expected Discharge Plan: OP Rehab Barriers to Discharge: Continued Medical Work up   Patient Goals and CMS Choice            Expected Discharge Plan and Services       Living arrangements for the past 2 months: Apartment                                      Prior Living Arrangements/Services Living arrangements for the past 2 months: Apartment Lives with:: Parents, Siblings Patient language and need for interpreter reviewed:: Yes Do Matthew feel safe going back to the place where Matthew live?: Yes      Need for Family Participation in Patient Care: Yes (Comment) Care giver support system in place?: Yes (comment)   Criminal Activity/Legal Involvement Pertinent to Current Situation/Hospitalization: No - Comment as needed  Activities of Daily Living   ADL Screening (condition at time of admission) Independently performs ADLs?: Yes (appropriate for developmental age) Is the patient deaf or have difficulty hearing?: No Does the patient have difficulty seeing, even when wearing glasses/contacts?: No Does the patient have difficulty concentrating, remembering, or making decisions?: No  Permission Sought/Granted Permission sought to share information with : Facility Industrial/product designer granted to share information with : Yes, Verbal  Permission Granted     Permission granted to share info w AGENCY: as needed        Emotional Assessment       Orientation: : Oriented to Self, Oriented to Place, Oriented to  Time, Oriented to Situation Alcohol / Substance Use: Not Applicable Psych Involvement: No (comment)  Admission diagnosis:  Open left tibial fracture [S82.202B] Type I or II open displaced comminuted fracture of shaft of left tibia, initial encounter [S82.252B] Patient Active Problem List   Diagnosis Date Noted   Open left tibial fracture 08/15/2023   PCP:  [facility], [facility]:   [facility] [phone] Jonette Nestle, [place] - [address] [place] [address] Phone: [phone] Fax: [phone]  [id number] [address]), [place] - [address] [place] ([place]) [place] [address] Phone: [phone] Fax: [phone]  [facility] [place] [facility] [address] [place] [address] Phone: [phone] Fax: [phone]     Social Drivers of Health (SDOH) Social History: SDOH Screenings   Food Insecurity: No Food Insecurity (08/15/2023)  Housing: Low Risk  (08/15/2023)  Transportation Needs: No Transportation Needs (08/15/2023)  Utilities: Not At Risk (08/15/2023)  Financial Resource Strain: Patient Declined (01/18/2023)   Received from [facility]  Social Connections: Unknown (12/28/2022)   Received from [facility]  Tobacco Use:  Medium Risk (08/15/2023)   SDOH Interventions:     Readmission Risk Interventions     No data to display

## 2023-08-16 NOTE — Plan of Care (Signed)
   Problem: Education: Goal: Knowledge of General Education information will improve Description: Including pain rating scale, medication(s)/side effects and non-pharmacologic comfort measures Outcome: Progressing   Problem: Activity: Goal: Risk for activity intolerance will decrease Outcome: Progressing   Problem: Nutrition: Goal: Adequate nutrition will be maintained Outcome: Progressing   Problem: Coping: Goal: Level of anxiety will decrease Outcome: Progressing   Problem: Elimination: Goal: Will not experience complications related to bowel motility Outcome: Progressing   Problem: Safety: Goal: Ability to remain free from injury will improve Outcome: Progressing
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
## 2023-08-17 ENCOUNTER — Encounter (HOSPITAL_COMMUNITY): Payer: Self-pay | Admitting: Orthopedic Surgery

## 2023-08-17 LAB — CBC
HCT: 31.1 % — ABNORMAL LOW (ref 39.0–52.0)
Hemoglobin: 10.6 g/dL — ABNORMAL LOW (ref 13.0–17.0)
MCH: 30 pg (ref 26.0–34.0)
MCHC: 34.1 g/dL (ref 30.0–36.0)
MCV: 88.1 fL (ref 80.0–100.0)
Platelets: 173 10*3/uL (ref 150–400)
RBC: 3.53 MIL/uL — ABNORMAL LOW (ref 4.22–5.81)
RDW: 11.1 % — ABNORMAL LOW (ref 11.5–15.5)
WBC: 5.7 10*3/uL (ref 4.0–10.5)
nRBC: 0 % (ref 0.0–0.2)

## 2023-08-17 MED ORDER — OXYCODONE HCL 5 MG PO TABS
10.0000 mg | ORAL_TABLET | ORAL | Status: DC | PRN
Start: 2023-08-17 — End: 2023-08-18
  Administered 2023-08-17: 15 mg via ORAL
  Administered 2023-08-18: 10 mg via ORAL
  Filled 2023-08-17: qty 3

## 2023-08-17 MED ORDER — OXYCODONE HCL 5 MG PO TABS
5.0000 mg | ORAL_TABLET | ORAL | Status: DC | PRN
Start: 2023-08-17 — End: 2023-08-18
  Administered 2023-08-18: 5 mg via ORAL
  Filled 2023-08-17: qty 2

## 2023-08-17 MED ORDER — ACETAMINOPHEN 500 MG PO TABS
1000.0000 mg | ORAL_TABLET | Freq: Three times a day (TID) | ORAL | Status: DC
  Administered 2023-08-17 – 2023-08-18 (×4): 1000 mg via ORAL
  Filled 2023-08-17 (×4): qty 2

## 2023-08-17 NOTE — Plan of Care (Signed)
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
## 2023-08-17 NOTE — Progress Notes (Signed)
    Subjective:  Patient reports pain as mild to moderate.  Denies N/V/CP/SOB/Abd pain. He endorses LLE pain. He reports he is doing better today and did better with ambulation with PT today.  We discussed reducing IV pain medications and changing to oral meds. He is in agreement.   Objective:   VITALS:   Vitals:   08/16/23 1531 08/16/23 1938 08/17/23 0445 08/17/23 0803  BP: 138/77 (!) 140/82 128/76 136/75  Pulse: 68 72 62 66  Resp: 18 16 17 17   Temp: 98.7 F (37.1 C) 98.2 F (36.8 C) 98.3 F (36.8 C) 98.6 F (37 C)  TempSrc: Oral  Oral   SpO2: 100% 100% 100% 94%  Weight:      Height:        NAD. Sitting up in bed.  Neurologically intact ABD soft Neurovascular intact Sensation intact distally Intact pulses distally Dorsiflexion/Plantar flexion intact No cellulitis present Compartment soft Ace bandage over LLE.  Incisions CDI.  Prevena incisional dressing C/D/I. House vac no leaks detected. No drainage output.    Lab Results  Component Value Date   WBC 5.7 08/17/2023   HGB 10.6 (L) 08/17/2023   HCT 31.1 (L) 08/17/2023   MCV 88.1 08/17/2023   PLT 173 08/17/2023   BMET    Component Value Date/Time   NA 137 08/16/2023 0721   K 3.7 08/16/2023 0721   CL 103 08/16/2023 0721   CO2 21 (L) 08/16/2023 0721   GLUCOSE 112 (H) 08/16/2023 0721   BUN 7 08/16/2023 0721   CREATININE 0.96 08/16/2023 0721   CALCIUM 8.4 (L) 08/16/2023 0721   GFRNONAA >60 08/16/2023 0721     Assessment/Plan: 2 Days Post-Op   Principal Problem:   Open left tibial fracture   ABLA hemoglobin 10.6. Asymptomatic.   DVT ppx: Lovenox in hospital will transition to aspirin 81mg  BID, SCDs, TEDS PO pain control: Added ibuprofen  and tylenol yesterday, decrease IV pain medication and change to oral oxycodone.  PT/OT: 15 and 30 feet with PT.  Dispo:  - Discussed WBAT with walker vs crutches pending comfort, safety, and mobility.  - He does not feel ready to go home today due to pain.  He lives at home with his mom and sister, 2nd floor apt.  - Discussed prevena incisional dressing and vac care at discharge. Will need f/u within 7 days of discharge for removal of prevena incisional dressing.  - We discussed OPPT. - D/c pending pain control and PT clearance.    Harman Lightning 08/17/2023, 2:03 PM   [facility]  [place] [address], [address], [place], [place] [address] Phone: [phone] [email or web address] Facebook  [employer]

## 2023-08-17 NOTE — Progress Notes (Signed)
 Orthopedic Tech Progress Note Patient Details:  CHARELS BRENIZER 05-22-2000 [id number]  Ortho Devices Type of Ortho Device: Post (short leg) splint Ortho Device/Splint Location: lle Ortho Device/Splint Interventions: Ordered, Application, Adjustment   Post Interventions Patient Tolerated: Well, Ambulated well Instructions Provided: Care of device  Kermitt Pedlar 08/17/2023, 2:16 PM

## 2023-08-18 MED ORDER — SENNA 8.6 MG PO TABS
2.0000 | ORAL_TABLET | Freq: Every day | ORAL | 0 refills | Status: AC
Start: 2023-08-18 — End: 2023-09-02

## 2023-08-18 MED ORDER — IBUPROFEN 800 MG PO TABS: 800.0000 mg | ORAL_TABLET | Freq: Three times a day (TID) | ORAL | 1 refills | Status: AC

## 2023-08-18 MED ORDER — OXYCODONE HCL 5 MG PO TABS: 5.0000 mg | ORAL_TABLET | ORAL | 0 refills | Status: AC | PRN

## 2023-08-18 MED ORDER — DOCUSATE SODIUM 100 MG PO CAPS: 100.0000 mg | ORAL_CAPSULE | Freq: Two times a day (BID) | ORAL | 0 refills | Status: AC

## 2023-08-18 MED ORDER — ASPIRIN 81 MG PO CHEW: 81.0000 mg | CHEWABLE_TABLET | Freq: Two times a day (BID) | ORAL | 0 refills | Status: AC

## 2023-08-18 MED ORDER — POLYETHYLENE GLYCOL 3350 17 G PO PACK: 17.0000 g | PACK | Freq: Every day | ORAL | 0 refills | Status: AC | PRN

## 2023-08-18 MED ORDER — ONDANSETRON HCL 4 MG PO TABS: 4.0000 mg | ORAL_TABLET | Freq: Four times a day (QID) | ORAL | 0 refills | Status: AC | PRN

## 2023-08-18 MED ORDER — METHOCARBAMOL 500 MG PO TABS: 500.0000 mg | ORAL_TABLET | Freq: Four times a day (QID) | ORAL | 0 refills | Status: AC | PRN

## 2023-08-18 MED ORDER — ACETAMINOPHEN 500 MG PO TABS: 1000.0000 mg | ORAL_TABLET | Freq: Three times a day (TID) | ORAL | 0 refills | Status: AC

## 2023-08-18 NOTE — Plan of Care (Signed)
  Problem: Clinical Measurements: Goal: Ability to maintain clinical measurements within normal limits will improve Outcome: Progressing Goal: Will remain free from infection Outcome: Progressing Goal: Diagnostic test results will improve Outcome: Progressing Goal: Respiratory complications will improve Outcome: Progressing   Problem: Activity: Goal: Risk for activity intolerance will decrease Outcome: Progressing   Problem: Coping: Goal: Level of anxiety will decrease Outcome: Progressing   Problem: Nutrition: Goal: Adequate nutrition will be maintained Outcome: Completed/Met

## 2023-08-18 NOTE — Progress Notes (Signed)
Pt discharged home in stable condition. Discharge instructions given. Script sent to pharmacy of choice. No immediate questions or concerns at this time. Discharged from unit via wheelchair.  

## 2023-08-18 NOTE — Progress Notes (Signed)
    Subjective:  Patient reports pain as mild to moderate.  Denies N/V/CP/SOB/Abd pain. He reports BM yesterday, voiding well. He reports his pain was much better yesterday and today.  He is eager for d/c home today.  Discussed with him about making office appointment for Tuesday for prevena incisional vac removal.    Objective:   VITALS:   Vitals:   08/17/23 0803 08/17/23 1443 08/17/23 2035 08/18/23 0622  BP: 136/75 139/70 136/79 115/73  Pulse: 66 79 62 75  Resp: 17 20 20    Temp: 98.6 F (37 C) 98 F (36.7 C) 99.1 F (37.3 C) 98.2 F (36.8 C)  TempSrc:  Oral Oral Oral  SpO2: 94% 98% 98% 100%  Weight:      Height:        Patient sleeping, woken up for exam. NAD.  Neurologically intact ABD soft Neurovascular intact Sensation intact distally Intact pulses distally Dorsiflexion/Plantar flexion intact No cellulitis present Compartment soft Incisions CDI.  Prevena incisional dressing C/D/I. House vac no leaks detected. No drainage output.     Lab Results  Component Value Date   WBC 5.7 08/17/2023   HGB 10.6 (L) 08/17/2023   HCT 31.1 (L) 08/17/2023   MCV 88.1 08/17/2023   PLT 173 08/17/2023   BMET    Component Value Date/Time   NA 137 08/16/2023 0721   K 3.7 08/16/2023 0721   CL 103 08/16/2023 0721   CO2 21 (L) 08/16/2023 0721   GLUCOSE 112 (H) 08/16/2023 0721   BUN 7 08/16/2023 0721   CREATININE 0.96 08/16/2023 0721   CALCIUM 8.4 (L) 08/16/2023 0721   GFRNONAA >60 08/16/2023 4540     Assessment/Plan: 3 Days Post-Op   Principal Problem:   Open left tibial fracture   DVT ppx: Lovenox in hospital will transition to aspirin 81mg  BID, SCDs, TEDS PO pain control: Reports improved pain control with oral narcotics and OTC pain medications.  PT/OT: 15 and 30 feet with PT. Continue PT today.  Dispo:  - He reports he did well with the crutches.   - He feels more prepared to go home today with pain control and mobility.  - Discussed prevena  incisional dressing and vac care at discharge. Nurse to remove house vac and place Prevena portable vac upon discharge home. Will need f/u within 7 days of discharge for removal of prevena incisional dressing.  - We discussed OPPT. - D/c today pending PT clearance.    Harman Lightning 08/18/2023, 6:56 AM   [facility]  [place] [address], [address], [place], [place] [address] Phone: [phone] [email or web address] Facebook  [employer]

## 2023-08-18 NOTE — Progress Notes (Addendum)
    Durable Medical Equipment  (From admission, onward)           Start     Ordered   08/16/23 1409  For home use only DME Walker rolling  Once       Question Answer Comment  Walker: With 5 Inch Wheels   Patient needs a walker to treat with the following condition Gait instability      08/16/23 1410   08/16/23 1409  For home use only DME Bedside commode  Once       Comments: Confine to one room  Question:  Patient needs a bedside commode to treat with the following condition  Answer:  Gait instability   08/16/23 1410

## 2023-08-18 NOTE — Plan of Care (Signed)
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
## 2023-08-18 NOTE — Plan of Care (Signed)
  Problem: Clinical Measurements: Goal: Ability to maintain clinical measurements within normal limits will improve Outcome: Progressing   Problem: Clinical Measurements: Goal: Will remain free from infection 08/18/2023 1542 by Carlie Chen, RN Outcome: Completed/Met 08/18/2023 1032 by Carlie Chen, RN Outcome: Progressing Goal: Diagnostic test results will improve 08/18/2023 1542 by Carlie Chen, RN Outcome: Completed/Met 08/18/2023 1032 by Carlie Chen, RN Outcome: Progressing Goal: Respiratory complications will improve 08/18/2023 1542 by Carlie Chen, RN Outcome: Completed/Met 08/18/2023 1032 by Carlie Chen, RN Outcome: Progressing Goal: Cardiovascular complication will be avoided Outcome: Completed/Met   Problem: Activity: Goal: Risk for activity intolerance will decrease 08/18/2023 1542 by Carlie Chen, RN Outcome: Completed/Met 08/18/2023 1032 by Carlie Chen, RN Outcome: Progressing   Problem: Nutrition: Goal: Adequate nutrition will be maintained Outcome: Completed/Met   Problem: Coping: Goal: Level of anxiety will decrease 08/18/2023 1542 by Carlie Chen, RN Outcome: Completed/Met 08/18/2023 1032 by Carlie Chen, RN Outcome: Progressing

## 2023-08-18 NOTE — Discharge Summary (Signed)
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 directed    Drink plenty of fluids.  Prune juice may be helpful.  You may use a stool softener, such as Colace (over the counter) 100 mg twice a day.  Use MiraLax (over the counter) for constipation as needed.   DO NOT drive, shower or  take a tub bath until instructed by your physician   Complete by: As directed    Diet - low sodium heart healthy   Complete by: As directed    Discharge instructions   Complete by: As directed    Elevate toes above nose. Use cryotherapy as needed for pain and swelling.   Driving restrictions   Complete by: As directed    No driving for 6 weeks   Increase activity slowly as tolerated   Complete by: As directed    Lifting restrictions   Complete by: As directed    No lifting for 6 weeks   Post-operative opioid taper instructions:   Complete by: As directed    POST-OPERATIVE OPIOID TAPER INSTRUCTIONS: It is important to wean off of your opioid medication as soon as possible. If you do not need pain medication after your surgery it is ok to stop day one. Opioids include: Codeine,  Hydrocodone(Norco, Vicodin), Oxycodone(Percocet, oxycontin) and hydromorphone amongst others.  Long term and even short term use of opiods can cause: Increased pain response Dependence Constipation Depression Respiratory depression And more.  Withdrawal symptoms can include Flu like symptoms Nausea, vomiting And more Techniques to manage these symptoms Hydrate well Eat regular healthy meals Stay active Use relaxation techniques(deep breathing, meditating, yoga) Do Not substitute Alcohol to help with tapering If you have been on opioids for less than two weeks and do not have pain than it is ok to stop all together.  Plan to wean off of opioids This plan should start within one week post op of your joint replacement. Maintain the same interval or time between taking each dose and first decrease the dose.  Cut the total daily intake of opioids by one tablet each day Next start to increase the time between doses. The last dose that should be eliminated is the evening dose.      Weight bearing as tolerated   Complete by: As directed         Follow-up Information     Harman Lightning, PA-C. Schedule an appointment as soon as possible for a visit on 08/24/2023.   Specialty: Orthopedic Surgery Why: Follow-up within 7 days of discharge for removal of Prevena negative pressure incisional dressing and for wound re-check Contact information: [address], [address] [place] [address] [phone]         [facility] Follow up.   Specialty: Rehabilitation Why: Referral made for outpatient PT. Please call and arrange appointment time. Contact information: [address] [place]  [address] [phone]                 Signed: Harman Lightning 08/18/2023, 4:44 PM
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
